# Patient Record
Sex: Male | Born: 1975 | Hispanic: Yes | Marital: Married | Smoking: Never smoker
Health system: Southern US, Community
[De-identification: ages and names within clinical notes are randomized; demographics above are authoritative.]

---

## 2012-04-05 ENCOUNTER — Inpatient Hospital Stay: Payer: Self-pay | Admitting: Internal Medicine

## 2012-04-05 LAB — COMPREHENSIVE METABOLIC PANEL
Albumin: 3.1 g/dL — ABNORMAL LOW (ref 3.4–5.0)
Alkaline Phosphatase: 114 U/L (ref 50–136)
Co2: 24 mmol/L (ref 21–32)
EGFR (Non-African Amer.): >60
Glucose: 135 mg/dL — ABNORMAL HIGH (ref 65–99)
Potassium: 2.3 mmol/L — CL (ref 3.5–5.1)
SGOT(AST): 253 U/L — ABNORMAL HIGH (ref 15–37)
Sodium: 112 mmol/L — CL (ref 136–145)

## 2012-04-05 LAB — URINALYSIS, COMPLETE
Bacteria: NONE SEEN
Glucose,UR: 150 mg/dL (ref 0–75)
Hyaline Cast: 1
Leukocyte Esterase: NEGATIVE
Nitrite: NEGATIVE
Ph: 6 (ref 4.5–8.0)
Protein: 30
RBC,UR: 2 /HPF (ref 0–5)
Specific Gravity: 1.008 (ref 1.003–1.030)
Squamous Epithelial: NONE SEEN

## 2012-04-05 LAB — BASIC METABOLIC PANEL
Anion Gap: 10 (ref 7–16)
BUN: 4 mg/dL — ABNORMAL LOW (ref 7–18)
Calcium, Total: 8 mg/dL — ABNORMAL LOW (ref 8.5–10.1)
Chloride: 86 mmol/L — ABNORMAL LOW (ref 98–107)
Osmolality: 256 (ref 275–301)
Potassium: 2.4 mmol/L — CL (ref 3.5–5.1)
Sodium: 128 mmol/L — ABNORMAL LOW (ref 136–145)

## 2012-04-05 LAB — CBC
HCT: 41.8 % (ref 40.0–52.0)
HGB: 15.1 g/dL (ref 13.0–18.0)
MCH: 30.8 pg (ref 26.0–34.0)
MCHC: 36.1 g/dL — ABNORMAL HIGH (ref 32.0–36.0)
MCV: 85 fL (ref 80–100)
RBC: 4.91 10*6/uL (ref 4.40–5.90)
RDW: 12.7 % (ref 11.5–14.5)
WBC: 18 10*3/uL — ABNORMAL HIGH (ref 3.8–10.6)

## 2012-04-05 LAB — DRUG SCREEN, URINE
Barbiturates, Ur Screen: NEGATIVE (ref ?–200)
MDMA (Ecstasy)Ur Screen: NEGATIVE (ref ?–500)
Methadone, Ur Screen: NEGATIVE (ref ?–300)
Opiate, Ur Screen: NEGATIVE (ref ?–300)
Phencyclidine (PCP) Ur S: NEGATIVE (ref ?–25)
Tricyclic, Ur Screen: NEGATIVE (ref ?–1000)

## 2012-04-05 LAB — PROTIME-INR: Prothrombin Time: 14.5 secs (ref 11.5–14.7)

## 2012-04-05 LAB — TSH: Thyroid Stimulating Horm: 0.33 u[IU]/mL — ABNORMAL LOW

## 2012-04-05 LAB — LIPASE, BLOOD: Lipase: 310 U/L (ref 73–393)

## 2012-04-06 LAB — CBC WITH DIFFERENTIAL/PLATELET
Basophil #: 0 10*3/uL (ref 0.0–0.1)
Basophil %: 0.1 %
Eosinophil %: 0 %
HCT: 41.3 % (ref 40.0–52.0)
Lymphocyte #: 0.9 10*3/uL — ABNORMAL LOW (ref 1.0–3.6)
Lymphocyte %: 8.7 %
MCH: 31.1 pg (ref 26.0–34.0)
MCHC: 35.5 g/dL (ref 32.0–36.0)
Monocyte %: 9.6 %
Neutrophil #: 8.6 10*3/uL — ABNORMAL HIGH (ref 1.4–6.5)
Neutrophil %: 81.6 %
Platelet: 80 10*3/uL — ABNORMAL LOW (ref 150–440)

## 2012-04-06 LAB — BASIC METABOLIC PANEL
Anion Gap: 13 (ref 7–16)
Anion Gap: 8 (ref 7–16)
BUN: 2 mg/dL — ABNORMAL LOW (ref 7–18)
Calcium, Total: 7.7 mg/dL — ABNORMAL LOW (ref 8.5–10.1)
Chloride: 91 mmol/L — ABNORMAL LOW (ref 98–107)
Chloride: 96 mmol/L — ABNORMAL LOW (ref 98–107)
Co2: 28 mmol/L (ref 21–32)
Co2: 30 mmol/L (ref 21–32)
Creatinine: 0.67 mg/dL (ref 0.60–1.30)
Creatinine: 0.76 mg/dL (ref 0.60–1.30)
EGFR (African American): >60
EGFR (Non-African Amer.): >60
EGFR (Non-African Amer.): >60
Glucose: 119 mg/dL — ABNORMAL HIGH (ref 65–99)
Glucose: 167 mg/dL — ABNORMAL HIGH (ref 65–99)
Osmolality: 268 (ref 275–301)
Potassium: 2.6 mmol/L — ABNORMAL LOW (ref 3.5–5.1)
Sodium: 132 mmol/L — ABNORMAL LOW (ref 136–145)
Sodium: 134 mmol/L — ABNORMAL LOW (ref 136–145)

## 2012-04-06 LAB — HEPATIC FUNCTION PANEL A (ARMC)
Albumin: 2.9 g/dL — ABNORMAL LOW (ref 3.4–5.0)
Alkaline Phosphatase: 125 U/L (ref 50–136)
Bilirubin, Direct: 0.4 mg/dL — ABNORMAL HIGH (ref 0.00–0.20)
Bilirubin,Total: 1.1 mg/dL — ABNORMAL HIGH (ref 0.2–1.0)
SGPT (ALT): 124 U/L — ABNORMAL HIGH
Total Protein: 6 g/dL — ABNORMAL LOW (ref 6.4–8.2)

## 2012-04-06 LAB — HEMOGLOBIN A1C: Hemoglobin A1C: 6.1 % (ref 4.2–6.3)

## 2012-04-07 LAB — BASIC METABOLIC PANEL
Anion Gap: 9 (ref 7–16)
BUN: 3 mg/dL — ABNORMAL LOW (ref 7–18)
Calcium, Total: 8 mg/dL — ABNORMAL LOW (ref 8.5–10.1)
Chloride: 98 mmol/L (ref 98–107)
Co2: 29 mmol/L (ref 21–32)
Creatinine: 0.76 mg/dL (ref 0.60–1.30)
EGFR (African American): >60
EGFR (Non-African Amer.): >60
Glucose: 126 mg/dL — ABNORMAL HIGH (ref 65–99)
Osmolality: 270 (ref 275–301)
Potassium: 3 mmol/L — ABNORMAL LOW (ref 3.5–5.1)
Sodium: 136 mmol/L (ref 136–145)

## 2012-04-07 LAB — MAGNESIUM: Magnesium: 1.9 mg/dL

## 2013-05-16 ENCOUNTER — Emergency Department: Payer: Self-pay | Admitting: Emergency Medicine

## 2013-05-16 LAB — COMPREHENSIVE METABOLIC PANEL
Albumin: 4.4 g/dL (ref 3.4–5.0)
Alkaline Phosphatase: 102 U/L (ref 50–136)
BUN: 4 mg/dL — ABNORMAL LOW (ref 7–18)
Bilirubin,Total: 0.6 mg/dL (ref 0.2–1.0)
Calcium, Total: 8.8 mg/dL (ref 8.5–10.1)
EGFR (African American): >60
EGFR (Non-African Amer.): >60
SGOT(AST): 45 U/L — ABNORMAL HIGH (ref 15–37)
SGPT (ALT): 38 U/L (ref 12–78)
Sodium: 140 mmol/L (ref 136–145)

## 2013-05-16 LAB — CBC
HCT: 56.6 % — ABNORMAL HIGH (ref 40.0–52.0)
HGB: 19.4 g/dL — ABNORMAL HIGH (ref 13.0–18.0)
MCH: 31.8 pg (ref 26.0–34.0)
MCHC: 34.3 g/dL (ref 32.0–36.0)
Platelet: 256 10*3/uL (ref 150–440)
RBC: 6.1 10*6/uL — ABNORMAL HIGH (ref 4.40–5.90)
RDW: 16.1 % — ABNORMAL HIGH (ref 11.5–14.5)
WBC: 13.2 10*3/uL — ABNORMAL HIGH (ref 3.8–10.6)

## 2013-05-16 LAB — ETHANOL
Ethanol %: 0.431 % (ref 0.000–0.080)
Ethanol: 431 mg/dL

## 2013-06-04 ENCOUNTER — Emergency Department: Payer: Self-pay | Admitting: Emergency Medicine

## 2013-06-18 ENCOUNTER — Emergency Department: Payer: Self-pay | Admitting: Emergency Medicine

## 2013-06-18 LAB — COMPREHENSIVE METABOLIC PANEL
Albumin: 4.4 g/dL (ref 3.4–5.0)
BUN: 5 mg/dL — ABNORMAL LOW (ref 7–18)
Calcium, Total: 8.9 mg/dL (ref 8.5–10.1)
Chloride: 106 mmol/L (ref 98–107)
Co2: 24 mmol/L (ref 21–32)
Creatinine: 0.61 mg/dL (ref 0.60–1.30)
EGFR (African American): >60
Glucose: 115 mg/dL — ABNORMAL HIGH (ref 65–99)
Osmolality: 285 (ref 275–301)
Potassium: 3.5 mmol/L (ref 3.5–5.1)
SGOT(AST): 62 U/L — ABNORMAL HIGH (ref 15–37)
Sodium: 144 mmol/L (ref 136–145)

## 2013-06-18 LAB — URINALYSIS, COMPLETE
Bilirubin,UR: NEGATIVE
Ketone: NEGATIVE
Nitrite: NEGATIVE
Ph: 6 (ref 4.5–8.0)
RBC,UR: <1 /HPF (ref 0–5)
Squamous Epithelial: NONE SEEN
WBC UR: <1 /HPF (ref 0–5)

## 2013-06-18 LAB — CBC
HGB: 18.1 g/dL — ABNORMAL HIGH (ref 13.0–18.0)
MCV: 96 fL (ref 80–100)
Platelet: 233 10*3/uL (ref 150–440)
RBC: 5.48 10*6/uL (ref 4.40–5.90)
RDW: 15.3 % — ABNORMAL HIGH (ref 11.5–14.5)
WBC: 7.5 10*3/uL (ref 3.8–10.6)

## 2013-06-18 LAB — ETHANOL: Ethanol: 405 mg/dL

## 2013-06-18 LAB — TSH: Thyroid Stimulating Horm: 3.32 u[IU]/mL

## 2013-06-19 LAB — DRUG SCREEN, URINE
Amphetamines, Ur Screen: NEGATIVE (ref ?–1000)
Cannabinoid 50 Ng, Ur ~~LOC~~: NEGATIVE (ref ?–50)
Cocaine Metabolite,Ur ~~LOC~~: NEGATIVE (ref ?–300)
MDMA (Ecstasy)Ur Screen: NEGATIVE (ref ?–500)
Methadone, Ur Screen: NEGATIVE (ref ?–300)
Phencyclidine (PCP) Ur S: NEGATIVE (ref ?–25)
Tricyclic, Ur Screen: NEGATIVE (ref ?–1000)

## 2015-02-23 NOTE — H&P (Signed)
PATIENT NAME:  Damon Coleman, Damon Coleman MR#:  [id number] DATE OF BIRTH:  09-Aug-1976  DATE OF ADMISSION:  04/05/2012  PRIMARY CARE PHYSICIAN: None.   REFERRING PHYSICIAN:   Olivia Mackie, MD    CHIEF COMPLAINT: Not feeling well as well as seizure.   HISTORY OF PRESENT ILLNESS: The patient is a 39 year old Hispanic male who is brought in by family due to "not feeling well." On further questioning, there was a concern that he had a seizure yesterday with his tongue being bit. Initially, the patient reported that he does not drink much, but on further questioning he reports that he drinks at least 12 to more beers per day. The patient also was noted to have severe hyponatremia as well as hypokalemia. The patient reports that he has been having some abdominal pain in the epigastric region and has been nauseous and throwing up since yesterday. He has not had any fevers or chills. He does not remember having a seizure, but his family, his brother is at bedside, reports that he had a jerking type of movement. The patient reports that his last drink was yesterday. However, his alcohol level is less than 0.003%. Otherwise, he denies any chest pain, shortness of breath, palpitations. No syncope. He denies any urinary symptoms. No headaches or photophobia.   PAST MEDICAL HISTORY: None.   PAST SURGICAL HISTORY: None.   ALLERGIES: None.   MEDICATIONS: None.   SOCIAL HISTORY: He smokes one cigarette possibly on a daily basis, nothing more than that. He drinks at least 12 cans or more a day. He denies any liquor use. He denies any drug use.   FAMILY HISTORY: He is not aware of any family history.    REVIEW OF SYSTEMS: CONSTITUTIONAL: Denies any fevers. Complains of fatigue, weakness. Complains of epigastric pain. No weight loss. No weight gain. EYES: No blurred or double vision. No redness. No inflammation. No glaucoma. No cataracts. ENT: No tinnitus, no ear pain. No hearing loss. No seasonal or year-round allergies. No  epistaxis. No nasal discharge. No redness of the oropharynx. RESPIRATORY: He does complain of a nonproductive cough since yesterday. No wheezing. No hemoptysis. No chronic obstructive pulmonary disease. No tuberculosis. CARDIOVASCULAR: No chest pain. No orthopnea. No edema. No arrhythmia. GASTROINTESTINAL: No nausea, vomiting, diarrhea. No abdominal pain. No hematemesis. No melena. No gastroesophageal reflux disease. No irritable bowel syndrome. GENITOURINARY: Denies any dysuria, hematuria, renal calculus, or frequency. ENDOCRINE: Denies any polyuria, nocturia, or thyroid problems. HEME/LYMPH: Denies anemia, easy bruisability, or bleeding. SKIN: No acne. No rash. No changes in mole, hair or skin. MUSCULOSKELETAL: Denies any pain in the neck, back, or shoulder. NEUROLOGICAL: No numbness. No weakness. No cerebrovascular accident. No transient ischemic attack. PSYCHIATRIC: No anxiety. No insomnia. No ADD. No OCD.   PHYSICAL EXAMINATION:  VITAL SIGNS: Temperature 100.4, pulse 100, respirations 20, blood pressure 174/66.   GENERAL: The patient is a 39 year old Spanish male currently not in any acute distress.   HEENT: Head atraumatic, normocephalic. Pupils are equally round, reactive to light and accommodation. Extraocular movements are intact. There is no conjunctival pallor. No scleral icterus. Oropharynx is dry. There are no exudates. External ear exam shows no drainage or ulceration. Nasal exam shows no erythema.   NECK: No thyromegaly. No carotid bruits.    CARDIOVASCULAR: Regular rate and rhythm. No murmurs, rubs, clicks, or gallops. PMI is not displaced.   LUNGS: Clear to auscultation bilaterally without any rales, rhonchi, or wheezing.   ABDOMEN: Soft. There is tenderness but no guarding,  no rebound.   EXTREMITIES: No clubbing, cyanosis, or edema.   SKIN: No rash.   LYMPHATICS: No lymph nodes palpable.   VASCULAR: Good DP, PT pulses.   PSYCHIATRIC: Not anxious or depressed.    NEUROLOGICAL: Awake, alert, oriented x3. No focal deficits.   SKIN: No rash.   LABORATORY, DIAGNOSTIC AND RADIOLOGICAL DATA:  In the ED,  CT scan of the head shows no acute abnormality.  WBC 18.0, hemoglobin 15, platelet count 106. Glucose 135, BUN 6, creatinine 0.52, sodium 112, potassium 2.3, chloride 74, CO2 24, calcium is 7.2, bilirubin total 2.3, alkaline phosphatase 114, ALT 109, AST 253, total protein 6.6, albumin 3.1.  Alcohol level is less than 0.003.  TSH is 0.33.  Urinalysis shows 3+ blood, nitrites negative, leukocytes negative, RBCs only 2.  TUDS were negative.   ASSESSMENT AND PLAN: The patient is a 39 year old Spanish male who presented to the ED with not feeling well, possible seizure at home yesterday, noted to have severe hypokalemia, hyponatremia.   1. Seizure: Likely due to alcohol withdrawal, possibly due to hyponatremia. At this time, we will hold off on any antiepileptics, follow for any further signs or symptoms of withdrawal as well as seizure. If he continues to have seizures, we will consider starting him on Dilantin and get  EEG and Neurological evaluation.  2. Severe electrolyte imbalances: Likely related to his nausea, throwing up, as well as alcohol abuse. Also, his hyponatremia could be due to beer potomania. We will place him on normal saline, follow his electrolytes. I will also check a magnesium, replace as needed.  3. Leukocytosis: He has a cough, low-grade temperature. We will check a chest x-ray, rule out pneumonia, possible aspiration.  4. Abdominal pain: Check lipase to rule out pancreatitis. If chest x-ray is negative, lipase is normal, we will consider getting a CT scan of the abdomen.  5. Alcohol abuse: We will place him on CIWA protocol. The patient was counseled regarding the importance of quitting drinking.     TIME SPENT:   35 minutes.   ____________________________ Lacie ScottsShreyang H. Allena KatzPatel, MD shp:cbb D: 04/05/2012 17:04:42 ET T: 04/05/2012  18:21:23 ET JOB#: [id number]  cc: Calogero Geisen H. Allena KatzPatel, MD, <Dictator> Charise CarwinSHREYANG H Adriahna Shearman MD ELECTRONICALLY SIGNED 04/10/2012 8:04

## 2015-02-23 NOTE — Discharge Summary (Signed)
PATIENT NAME:  Damon Coleman, [name] MR#:  [id number] DATE OF BIRTH:  05/09/1976  DATE OF ADMISSION:  04/05/2012 DATE OF DISCHARGE:  04/07/2012  ADMISSION DIAGNOSE:  1. Seizure, likely alcohol withdrawal.  2. Severe electrolyte imbalances.   DISCHARGE DIAGNOSES:  1. Probable seizure, likely alcohol withdrawal.  2. Electrolyte imbalances with hyponatremia and hypokalemia due to alcoholic binge and poor p.o. intake.  3. Leukocytosis.  4. Alcohol abuse.  5. Thrombocytopenia.   CONSULTS: None.   LABORATORY, DIAGNOSTIC, AND RADIOLOGICAL DATA: Sodium 136, potassium 3.0, chloride 98, bicarb 29, BUN 3, creatinine 0.76, glucose 126. Magnesium 1.9. A1c 6.1. White blood cells 10.5, hemoglobin 14.7, hematocrit 41.3, platelets 80.   HOSPITAL COURSE: This is a 39 year old male who went on a drinking binge. He was brought in by his brother for weakness and possible seizure. For further details, please refer to the history and physical.  1. Seizure, likely alcohol withdrawal. No episodes of seizure were seen here.  2. Tachycardia secondary to alcohol withdrawal and dehydration. The patient's tachycardia resolved.  3. Hyponatremia and hypokalemia from decreased p.o. intake and binge drinking which improved with IV fluids and replacement of the potassium. Magnesium level was normal.  4. Alcoholic hepatitis. We encouraged the patient to stop drinking and Case Management did provide some information regarding alcohol substance abuse.  5. Leukocytosis, likely stress induced which has resolved.   DISCHARGE MEDICATIONS: Librium 25 mg p.o. q.8 hours p.r.n. tapering doses over the next three days until stopped.   FOLLOW-UP: The patient will follow-up with [facility] in 1 to 2 days.   DISCHARGE DIET: Regular.   DISCHARGE ACTIVITY: As tolerated.   TIME SPENT: Approximately 35 minutes.   ____________________________ Janyth ContesSital P. Juliene PinaMody, MD spm:drc D: 04/07/2012 13:53:00 ET T: 04/07/2012 16:09:01  ET JOB#: [id number]  cc: Latania Bascomb P. Juliene PinaMody, MD, <Dictator> [facility] Peightyn Roberson P Emoni Whitworth MD ELECTRONICALLY SIGNED 04/12/2012 12:26

## 2015-05-17 IMAGING — CT CT HEAD WITHOUT CONTRAST
2 series · 16 of 30 positions shown, 20 images · non-contrast
Comparison: none

REASON FOR EXAM: +ETOH, FALL
COMMENTS:

PROCEDURE:     CT  - CT HEAD WITHOUT CONTRAST  - May 16, 2013  [DATE]
RESULT:     Comparison:  None
TECHNIQUE: Multiple axial images from the foramen magnum to the vertex were
obtained without IV contrast.

[Series 2: without · axial · non-contrast · 0.41mm/px · z∈[+968,+1108]mm · 13 of 34 slices shown, 17 images]
[im 3/34  brain]
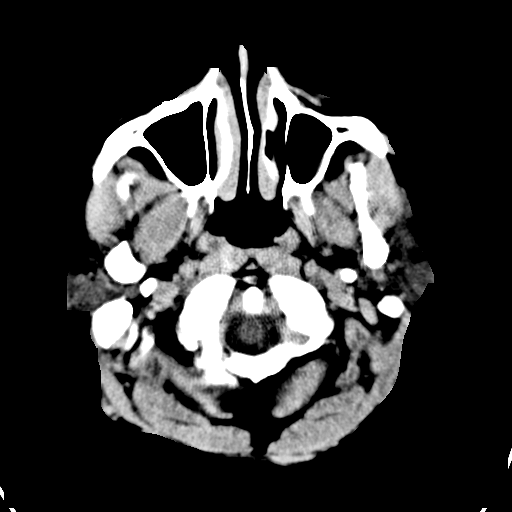
[im 3/34  bone]
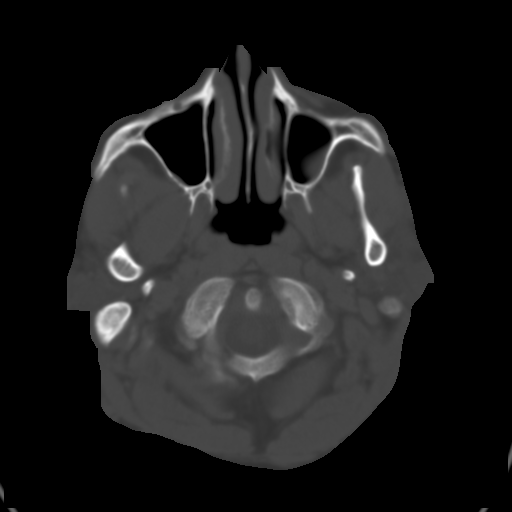
[im 5/34  brain]
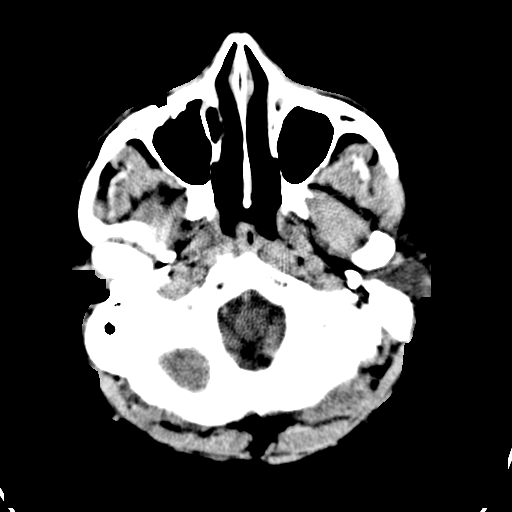
[im 8/34  brain]
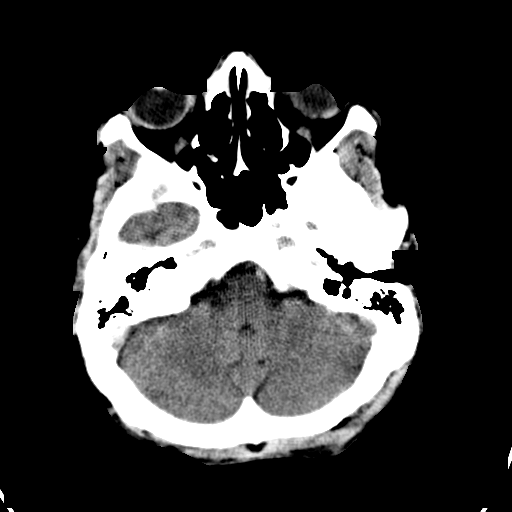
[im 10/34  brain]
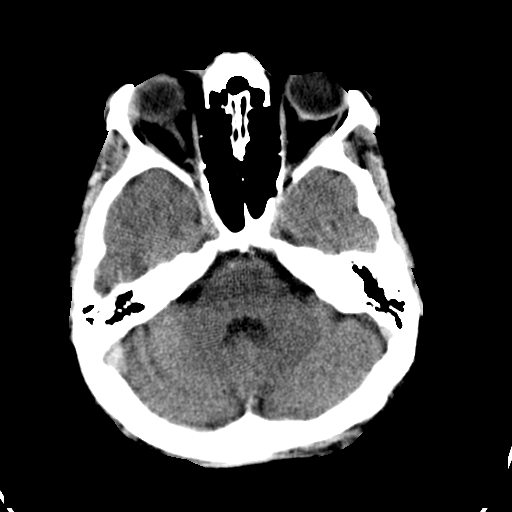
[im 12/34  brain]
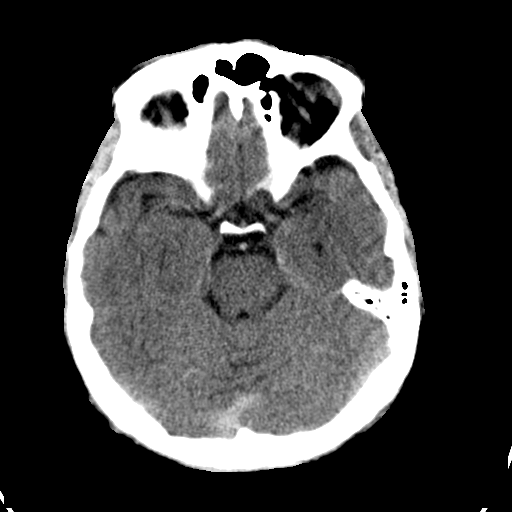
[im 12/34  bone]
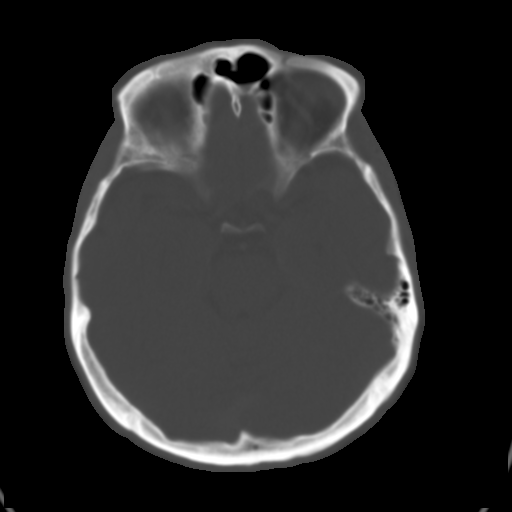
[im 15/34  brain]
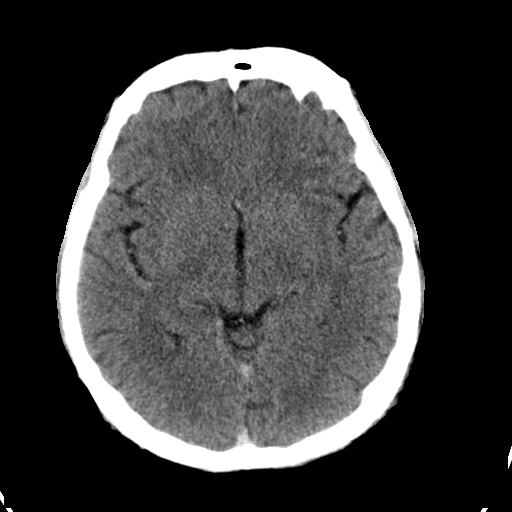
[im 17/34  brain]
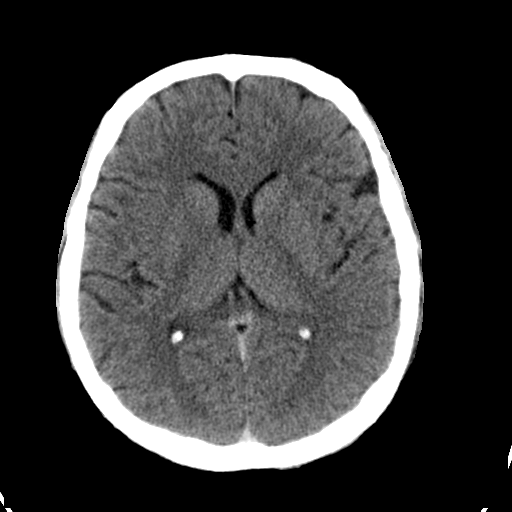
[im 19/34  brain]
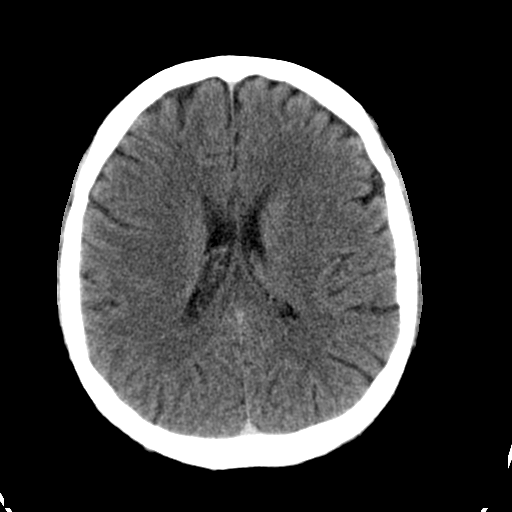
[im 22/34  brain]
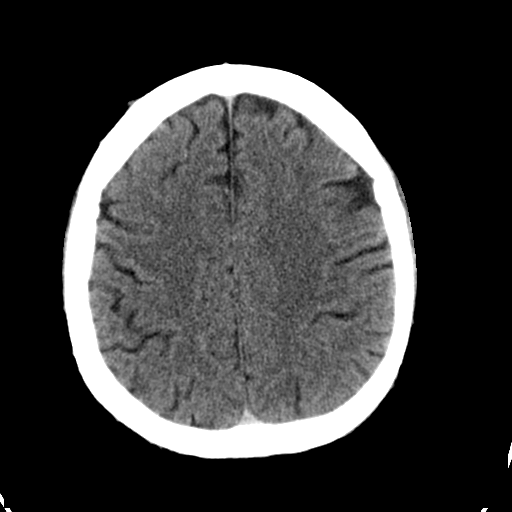
[im 22/34  bone]
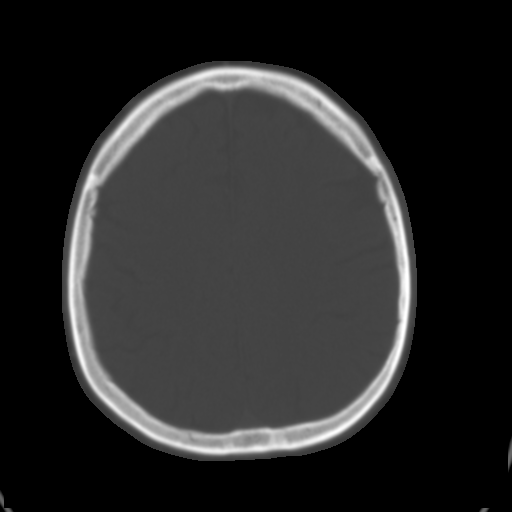
[im 24/34  brain]
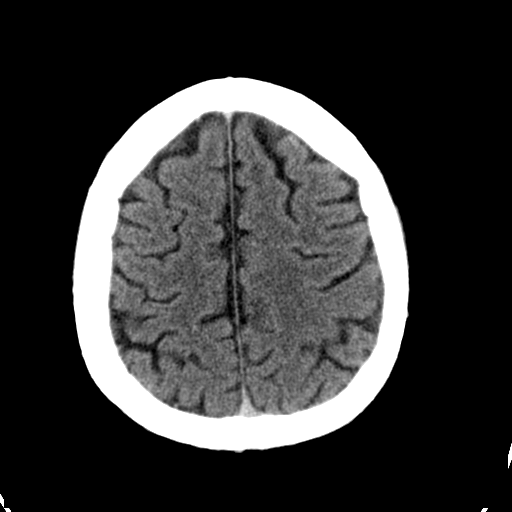
[im 26/34  brain]
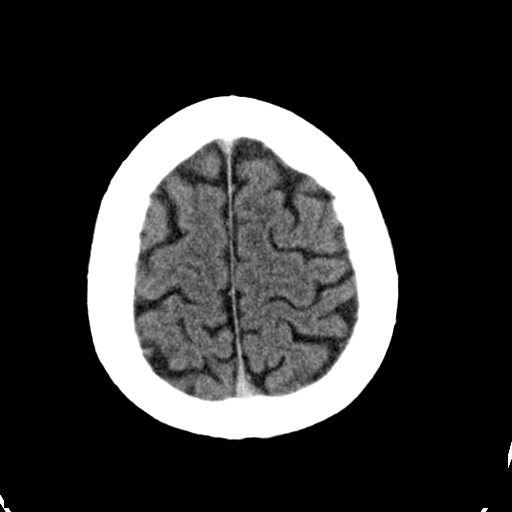
[im 29/34  brain]
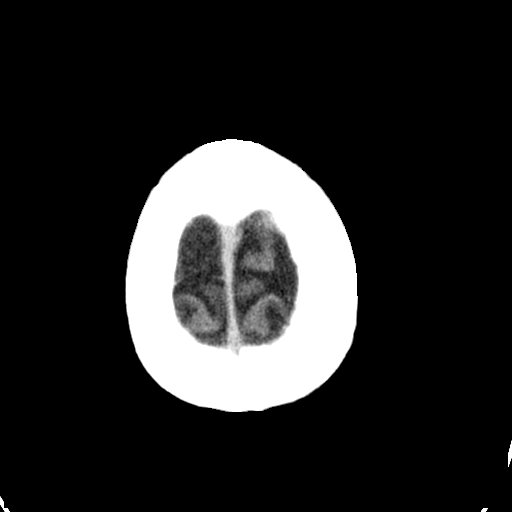
[im 31/34  brain]
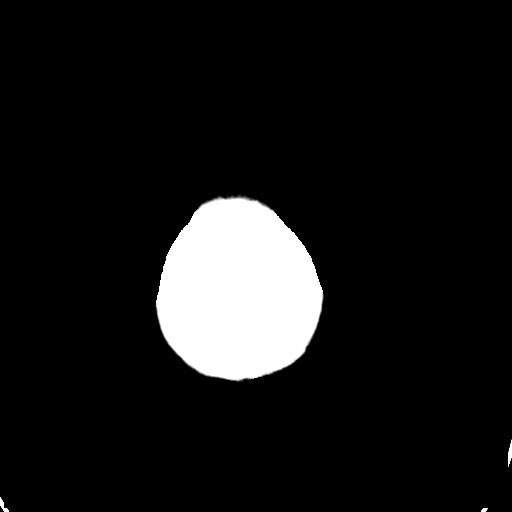
[im 31/34  bone]
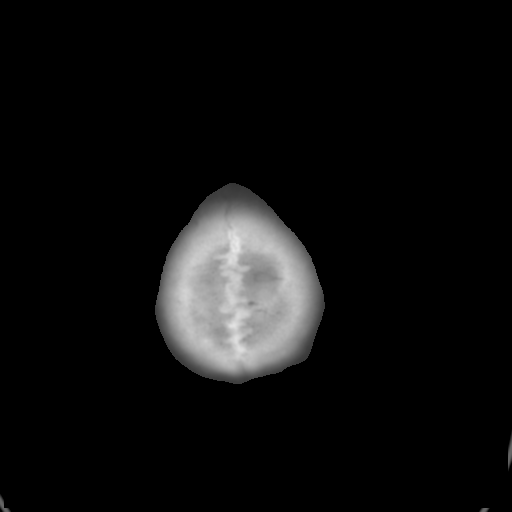

[Series 3: bone · axial · 0.41mm/px · z∈[+968,+1013]mm · 3 of 34 slices shown]
[im 3/34  bone]
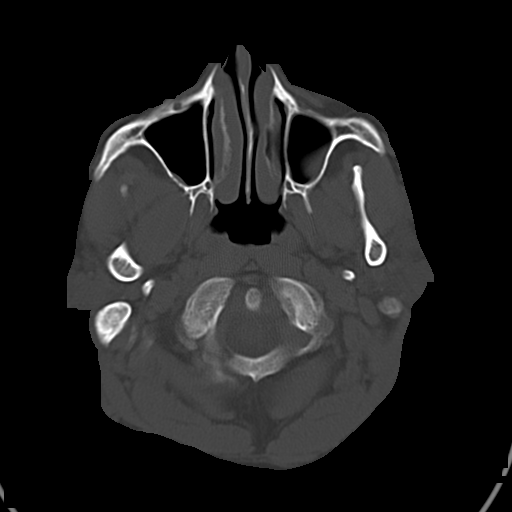
[im 8/34  bone]
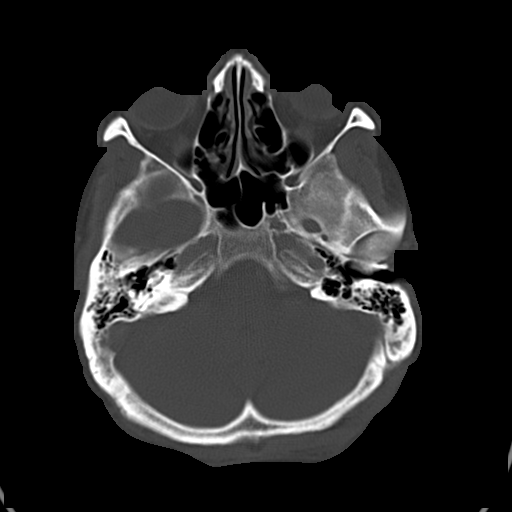
[im 12/34  bone]
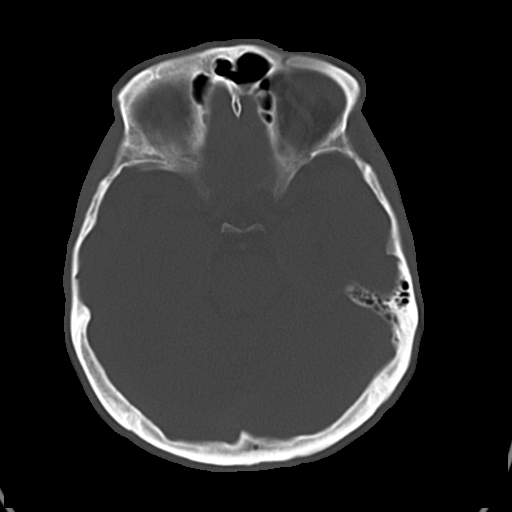

[16 of 30 positions shown; findings below may reference images not displayed]

FINDINGS: There is no evidence of mass effect, midline shift, or extra-axial fluid
collections.  There is no evidence of a space-occupying lesion or
intracranial hemorrhage. There is no evidence of a cortical-based area of
acute infarction.

The ventricles and sulci are appropriate for the patient's age. The basal
cisterns are patent.

Visualized portions of the orbits are unremarkable. There is a small left
maxillary sinus mucus retention cyst.

The osseous structures are unremarkable. There is a tiny amount of
subcutaneous emphysema in the left parietal scalp and left pinna.
IMPRESSION: No acute intracranial process.

[REDACTED]

## 2015-05-17 IMAGING — CT CT CERVICAL SPINE WITHOUT CONTRAST
3 series · 16 of 33 positions shown, 19 images · non-contrast
Comparison: None

REASON FOR EXAM: +ETOH, FALL
COMMENTS:   May transport without cardiac monitor

PROCEDURE:     CT  - CT CERVICAL SPINE WO  - May 16, 2013 [DATE]
RESULT:     Clinical Indication: Trauma
TECHNIQUE: Multiple axial CT images from the skull base to the mid vertebral
body of T1. obtained with sagittal and coronal reformatted images provided.

[Series 3: axial · axial · 0.33mm/px · z∈[+837,+973]mm · 8 of 85 slices shown, 10 images]
[im 7/85  soft-tissue]
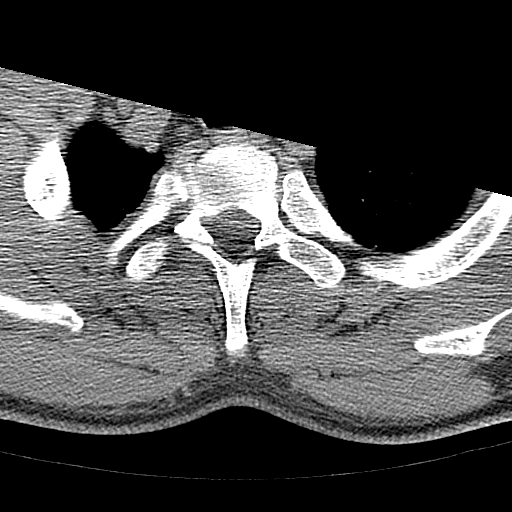
[im 7/85  bone]
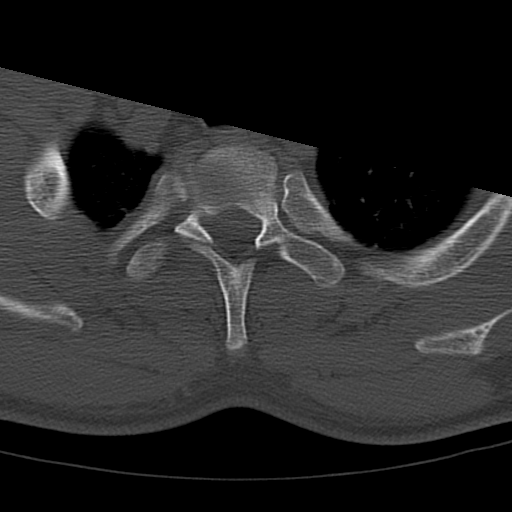
[im 20/85  bone]
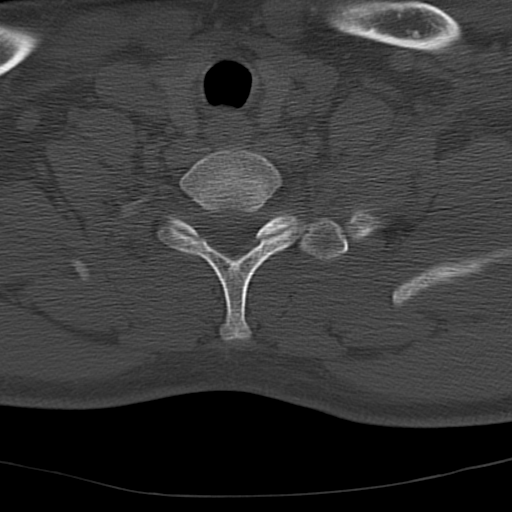
[im 26/85  bone]
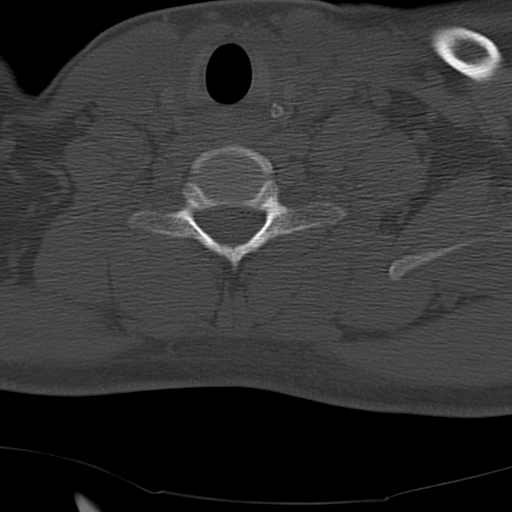
[im 39/85  bone]
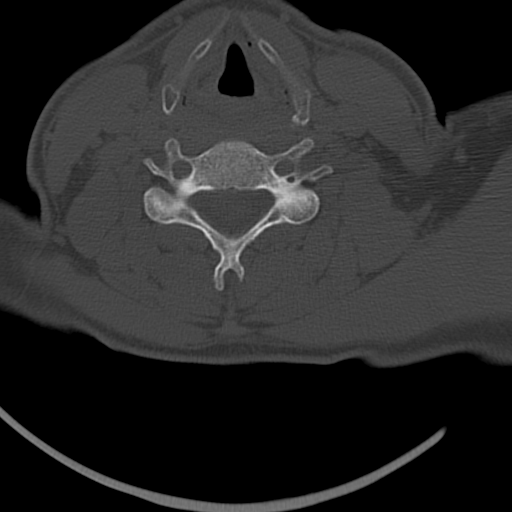
[im 46/85  soft-tissue]
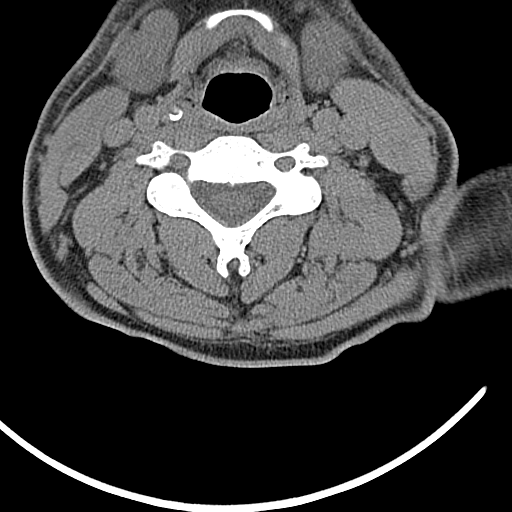
[im 46/85  bone]
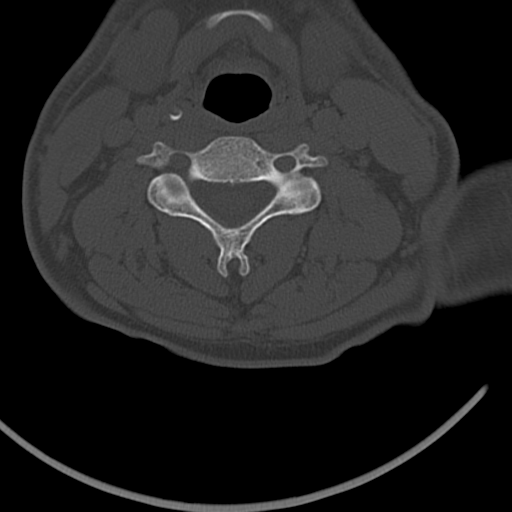
[im 59/85  bone]
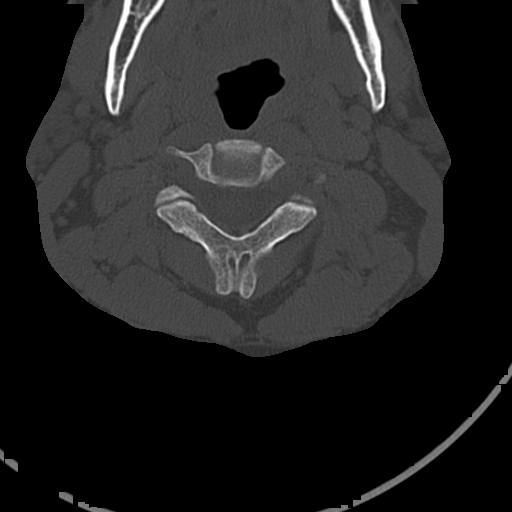
[im 65/85  bone]
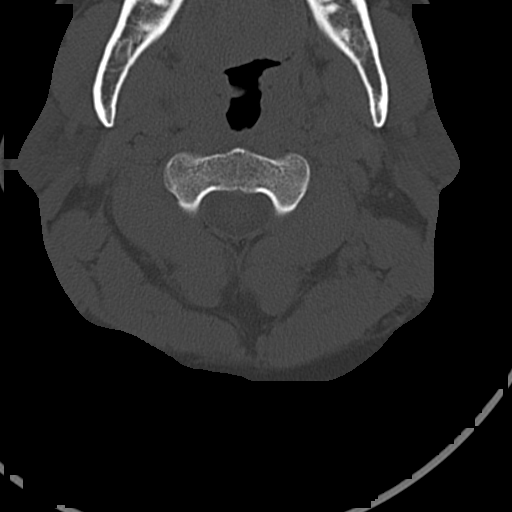
[im 78/85  bone]
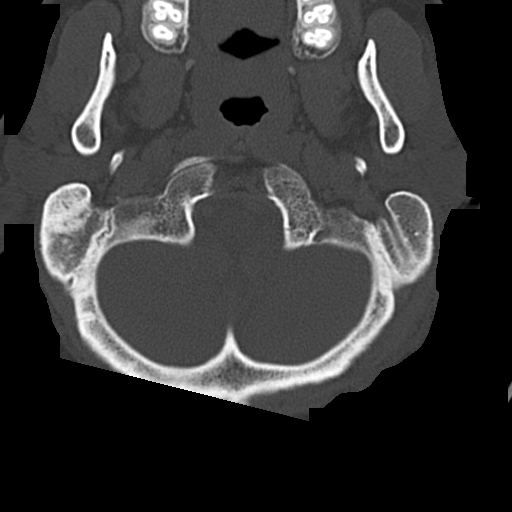

[Series 4: sagittal · sagittal · 0.38mm/px · 5 of 46 slices shown, 6 images]
[im 16/46  bone]
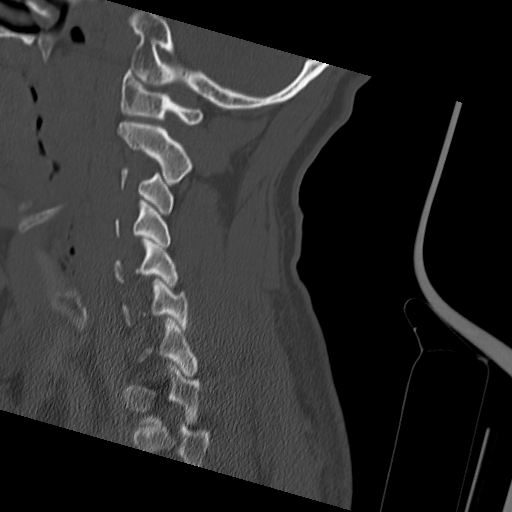
[im 19/46  bone]
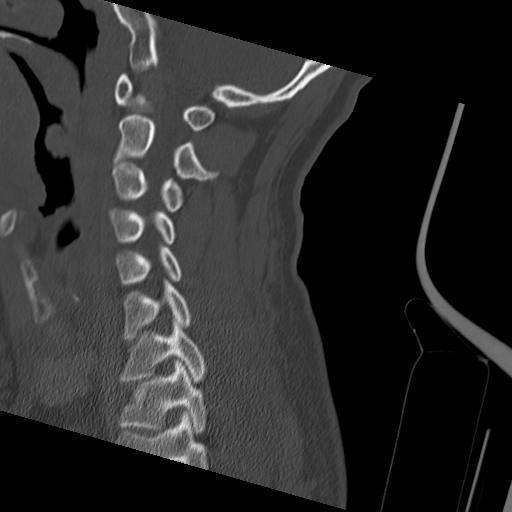
[im 23/46  soft-tissue]
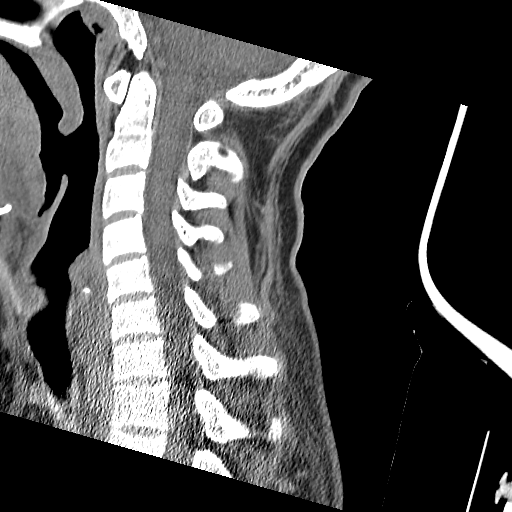
[im 23/46  bone]
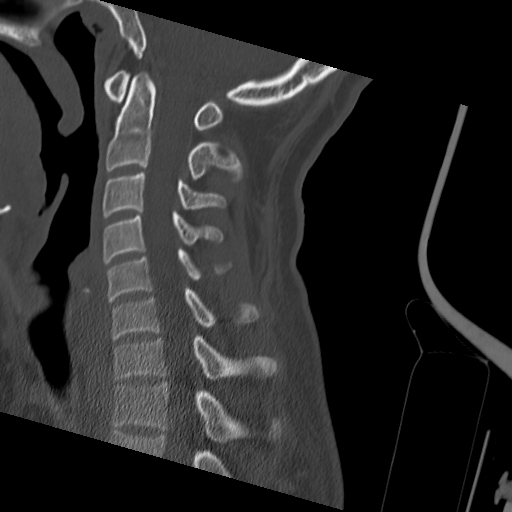
[im 27/46  bone]
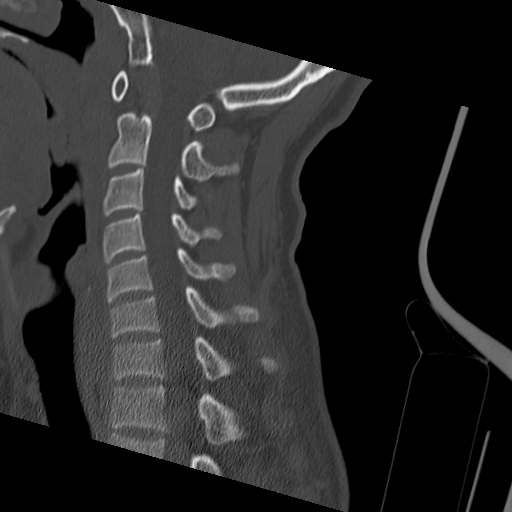
[im 31/46  bone]
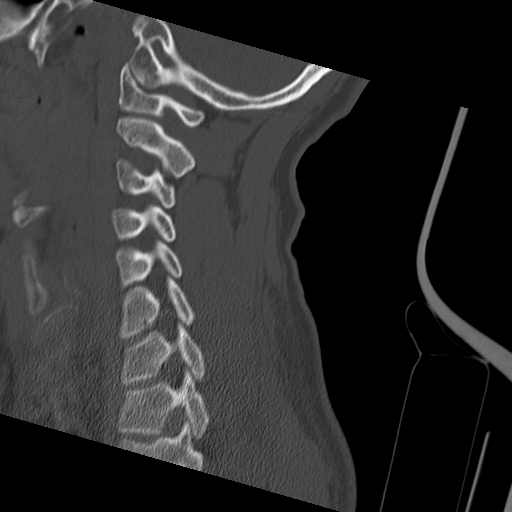

[Series 5: coronal · coronal · 0.39mm/px · 3 of 43 slices shown]
[im 9/43  bone]
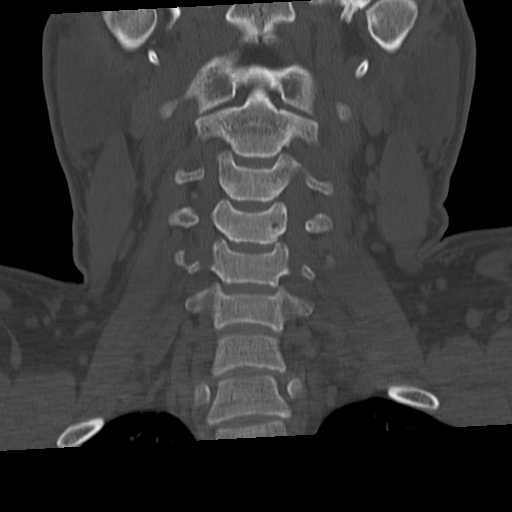
[im 17/43  bone]
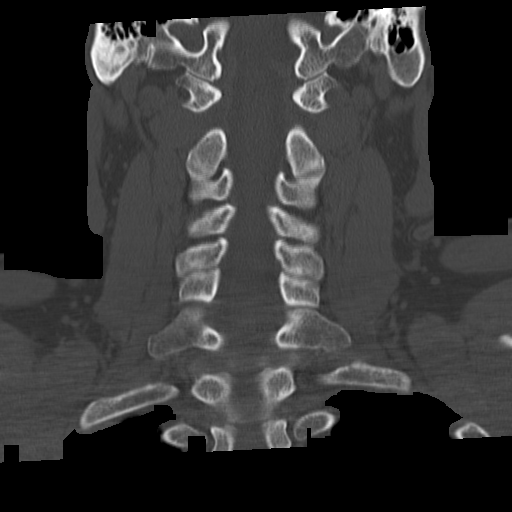
[im 26/43  bone]
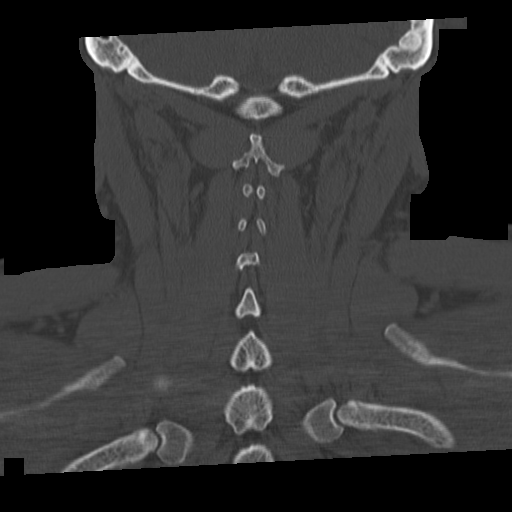

[16 of 33 positions shown; findings below may reference images not displayed]

FINDINGS: The alignment is anatomic. The vertebral body heights are maintained. There
is no acute fracture or static listhesis. The prevertebral soft tissues are
normal. The intraspinal soft tissues are not fully imaged on this
examination due to poor soft tissue contrast, but there is no soft tissue
gross abnormality.

The disc spaces are maintained.

The visualized portions of the lung apices demonstrate no focal abnormality.
IMPRESSION: 1. No acute osseous injury of the cervical spine.

2. Ligamentous injury is not evaluated. If there is high clinical concern
for ligamentous injury, consider MRI or flexion/extension radiographs as
clinically indicated and tolerated.

[REDACTED]

## 2015-06-05 ENCOUNTER — Encounter: Payer: Self-pay | Admitting: Urgent Care

## 2015-06-05 ENCOUNTER — Emergency Department
Admission: EM | Admit: 2015-06-05 | Discharge: 2015-06-06 | Disposition: A | Discharge status: Home / Self Care | Payer: Self-pay | Attending: Emergency Medicine | Admitting: Emergency Medicine

## 2015-06-05 DIAGNOSIS — F10920 Alcohol use, unspecified with intoxication, uncomplicated: Secondary | ICD-10-CM

## 2015-06-05 DIAGNOSIS — F10120 Alcohol abuse with intoxication, uncomplicated: Secondary | ICD-10-CM | POA: Insufficient documentation

## 2015-06-05 LAB — COMPREHENSIVE METABOLIC PANEL
ALBUMIN: 5 g/dL (ref 3.5–5.0)
ALK PHOS: 75 U/L (ref 38–126)
ALT: 40 U/L (ref 17–63)
AST: 51 U/L — ABNORMAL HIGH (ref 15–41)
Anion gap: 16 — ABNORMAL HIGH (ref 5–15)
BILIRUBIN TOTAL: 0.4 mg/dL (ref 0.3–1.2)
BUN: 6 mg/dL (ref 6–20)
CALCIUM: 8.9 mg/dL (ref 8.9–10.3)
CHLORIDE: 101 mmol/L (ref 101–111)
CO2: 25 mmol/L (ref 22–32)
CREATININE: 0.63 mg/dL (ref 0.61–1.24)
GFR calc Af Amer: >60 mL/min (ref >60)
Glucose, Bld: 109 mg/dL — ABNORMAL HIGH (ref 65–99)
POTASSIUM: 3.9 mmol/L (ref 3.5–5.1)
Sodium: 142 mmol/L (ref 135–145)
TOTAL PROTEIN: 8.8 g/dL — AB (ref 6.5–8.1)

## 2015-06-05 LAB — URINALYSIS COMPLETE WITH MICROSCOPIC (ARMC ONLY)
BACTERIA UA: NONE SEEN
Bilirubin Urine: NEGATIVE
Glucose, UA: NEGATIVE mg/dL
Hgb urine dipstick: NEGATIVE
KETONES UR: NEGATIVE mg/dL
Leukocytes, UA: NEGATIVE
Nitrite: NEGATIVE
Protein, ur: NEGATIVE mg/dL
SPECIFIC GRAVITY, URINE: 1.002 — AB (ref 1.005–1.030)
Squamous Epithelial / LPF: NONE SEEN
WBC, UA: NONE SEEN WBC/hpf (ref 0–5)
pH: 6 (ref 5.0–8.0)

## 2015-06-05 LAB — CBC
HEMATOCRIT: 54.7 % — AB (ref 40.0–52.0)
Hemoglobin: 18.6 g/dL — ABNORMAL HIGH (ref 13.0–18.0)
MCH: 32.5 pg (ref 26.0–34.0)
MCHC: 34.1 g/dL (ref 32.0–36.0)
MCV: 95.6 fL (ref 80.0–100.0)
Platelets: 223 10*3/uL (ref 150–440)
RBC: 5.72 MIL/uL (ref 4.40–5.90)
RDW: 14.3 % (ref 11.5–14.5)
WBC: 8.8 10*3/uL (ref 3.8–10.6)

## 2015-06-05 LAB — URINE DRUG SCREEN, QUALITATIVE (ARMC ONLY)
Amphetamines, Ur Screen: NOT DETECTED
Barbiturates, Ur Screen: NOT DETECTED
Benzodiazepine, Ur Scrn: NOT DETECTED
Cannabinoid 50 Ng, Ur ~~LOC~~: NOT DETECTED
Cocaine Metabolite,Ur ~~LOC~~: NOT DETECTED
MDMA (Ecstasy)Ur Screen: NOT DETECTED
Methadone Scn, Ur: NOT DETECTED
Opiate, Ur Screen: NOT DETECTED
Phencyclidine (PCP) Ur S: NOT DETECTED
Tricyclic, Ur Screen: NOT DETECTED

## 2015-06-05 LAB — ETHANOL: Alcohol, Ethyl (B): 451 mg/dL (ref <5)

## 2015-06-05 MED ORDER — LORAZEPAM 2 MG PO TABS: 0.0000 mg | ORAL_TABLET | Freq: Four times a day (QID) | ORAL | Status: DC

## 2015-06-05 MED ORDER — LORAZEPAM 2 MG PO TABS: 0.0000 mg | ORAL_TABLET | Freq: Two times a day (BID) | ORAL | Status: DC

## 2015-06-05 MED ORDER — THIAMINE HCL 100 MG/ML IJ SOLN
Freq: Once | INTRAVENOUS | Status: AC
  Administered 2015-06-05: via INTRAVENOUS
  Filled 2015-06-05: qty 1000

## 2015-06-05 MED ORDER — THIAMINE HCL 100 MG/ML IJ SOLN: 100.0000 mg | Freq: Every day | INTRAMUSCULAR | Status: DC

## 2015-06-05 MED ORDER — VITAMIN B-1 100 MG PO TABS
100.0000 mg | ORAL_TABLET | Freq: Every day | ORAL | Status: DC
  Administered 2015-06-06: 100 mg via ORAL
  Filled 2015-06-05: qty 1

## 2015-06-05 NOTE — ED Notes (Signed)
Notifed Dr Pershing Proud of critical ETOH Level--- 451

## 2015-06-05 NOTE — ED Notes (Signed)
PA in room with pt at this time

## 2015-06-05 NOTE — ED Notes (Signed)
Patient presents to ED in custody of [employer] officer Earl Lites; under IVC. Papers indicate that patient has been intoxicated since Saturday. Patient has not been eating, bathing, and has been exhibiting erratic behavior. Patient not making sense when he is speaking per wife's report (via IVC papers). Patient denies SI/HI.

## 2015-06-05 NOTE — BH Assessment (Signed)
Assessment Note  Damon Coleman is an 39 y.o. male presenting to the ED via [employer] under IVC.  According to the IVC, pt has been intoxicated since Saturday, 06/04/2015 and has not eaten, bathed or showered.  The IVC reports that pt has been displaying erractic behavior and is not making sense.  Pt reports he consumed 5-6 12 ounce beers today (06/05/2015).  He reports his last meal was at 4 pm today.  Pt reports no difficulty with eating or sleeping. Pt denies using any other drugs.  Pt's BAC is 451.  Pt denies any SI/HI or AH/VH.    Axis I: Substance Abuse Axis II: Deferred Axis III: History reviewed. No pertinent past medical history. Axis IV: problems with primary support group Axis V: 61-70 mild symptoms  Past Medical History: History reviewed. No pertinent past medical history.  History reviewed. No pertinent past surgical history.  Family History: No family history on file.  Social History:  reports that he has never smoked. He does not have any smokeless tobacco history on file. He reports that he drinks alcohol. He reports that he does not use illicit drugs.  Additional Social History:  Alcohol / Drug Use History of alcohol / drug use?: Yes Negative Consequences of Use: Personal relationships Substance #1 Name of Substance 1: ETOH 1 - Age of First Use: since teens 1 - Amount (size/oz): 6 12 oz cans 1 - Frequency: daily 1 - Duration: all day 1 - Last Use / Amount: 06/05/15  CIWA: CIWA-Ar BP: 136/88 mmHg Pulse Rate: (!) 104 Nausea and Vomiting: no nausea and no vomiting Tactile Disturbances: none Tremor: no tremor Auditory Disturbances: not present Paroxysmal Sweats: no sweat visible Visual Disturbances: not present Anxiety: no anxiety, at ease Headache, Fullness in Head: none present Agitation: normal activity Orientation and Clouding of Sensorium: oriented and can do serial additions CIWA-Ar Total: 0 COWS:    Allergies: No Known Allergies  Home Medications:  (Not in  a hospital admission)  OB/GYN Status:  No LMP for male patient.  General Assessment Data Location of Assessment: [facility] ED TTS Assessment: In system Is this a Tele or Face-to-Face Assessment?: Face-to-Face Is this an Initial Assessment or a Re-assessment for this encounter?: Initial Assessment Marital status: Married [place] name: N/A Is patient pregnant?: No Pregnancy Status: No Living Arrangements: Spouse/significant other Can pt return to current living arrangement?: Yes Admission Status: Involuntary Is patient capable of signing voluntary admission?: No Referral Source: Self/Family/Friend Insurance type: IVC     Crisis Care Plan Living Arrangements: Spouse/significant other Name of Psychiatrist: N/A  Education Status Is patient currently in school?: No Highest grade of school patient has completed: 12  Risk to self with the past 6 months Suicidal Ideation: No Has patient been a risk to self within the past 6 months prior to admission? : No Suicidal Intent: No Has patient had any suicidal intent within the past 6 months prior to admission? : No Is patient at risk for suicide?: No Suicidal Plan?: No Has patient had any suicidal plan within the past 6 months prior to admission? : No Access to Means: No What has been your use of drugs/alcohol within the last 12 months?: ETOH, 5-6 12 oz cans daily Previous Attempts/Gestures: No How many times?: 0 Other Self Harm Risks: None  Triggers for Past Attempts: None known Intentional Self Injurious Behavior: None Family Suicide History: No Recent stressful life event(s): Conflict (Comment) (Conflict with wife) Persecutory voices/beliefs?: No Depression: No Substance abuse history and/or treatment  for substance abuse?: Yes Suicide prevention information given to non-admitted patients: Not applicable  Risk to Others within the past 6 months Homicidal Ideation: No Does patient have any lifetime risk of violence toward others  beyond the six months prior to admission? : No Thoughts of Harm to Others: No Current Homicidal Intent: No Current Homicidal Plan: No Access to Homicidal Means: No Identified Victim: N/A History of harm to others?: No Assessment of Violence: On admission Violent Behavior Description: None Does patient have access to weapons?: No Criminal Charges Pending?: No Does patient have a court date: No Is patient on probation?: No  Psychosis Hallucinations: None noted Delusions: None noted  Mental Status Report Appearance/Hygiene: In scrubs Eye Contact: Good Motor Activity: Freedom of movement Speech: Logical/coherent Level of Consciousness: Quiet/awake, Drowsy Mood: Pleasant, Other (Comment) (Intoxicated) Affect: Appropriate to circumstance Anxiety Level: None Judgement: Impaired Orientation: Person, Place, Time Obsessive Compulsive Thoughts/Behaviors: None  Cognitive Functioning Concentration: Normal Memory: Recent Impaired IQ: Average Insight: Fair Impulse Control: Fair Appetite: Good Weight Loss: 0 Weight Gain: 0 Sleep: No Change Total Hours of Sleep: 8 Vegetative Symptoms: None  ADLScreening [facility] Assessment Services) Patient's cognitive ability adequate to safely complete daily activities?: Yes Patient able to express need for assistance with ADLs?: Yes Independently performs ADLs?: Yes (appropriate for developmental age)  Prior Inpatient Therapy Prior Inpatient Therapy: No  Prior Outpatient Therapy Prior Outpatient Therapy: No Does patient have an ACCT team?: No Does patient have Intensive In-House Services?  : No Does patient have [facility] services? : No Does patient have P4CC services?: No  ADL Screening (condition at time of admission) Patient's cognitive ability adequate to safely complete daily activities?: Yes Patient able to express need for assistance with ADLs?: Yes Independently performs ADLs?: Yes (appropriate for developmental age)        Abuse/Neglect Assessment (Assessment to be complete while patient is alone) Physical Abuse: Denies Verbal Abuse: Denies Sexual Abuse: Denies Exploitation of patient/patient's resources: Denies Self-Neglect: Denies Values / Beliefs Cultural Requests During Hospitalization: None Spiritual Requests During Hospitalization: None Consults Spiritual Care Consult Needed: No Social Work Consult Needed: No      Additional Information 1:1 In Past 12 Months?: No CIRT Risk: No Elopement Risk: No     Disposition:  Disposition Initial Assessment Completed for this Encounter: Yes Disposition of Patient: Other dispositions Other disposition(s): Other (Comment) (Psych MD Consult)  On Site Evaluation by:   Reviewed with Physician:    Manus Rudd Garret Teale 06/05/2015 11:02 PM

## 2015-06-05 NOTE — ED Provider Notes (Addendum)
[facility] Emergency Department Provider Note  ____________________________________________  Time seen: Approximately 1040 PM  I have reviewed the triage vital signs and the nursing notes.   HISTORY  Chief Complaint Psychiatric Evaluation    HPI Damon Coleman is a 39 y.o. male without any pertinent medical history presents under involuntary commitment from his family because he has been intoxicated since this past Saturday. Pretty neurologic the patient has also not been eating, bathing and showing erratic behavior. Family also says the patient is not making sense for detox. The patient denies any suicidal or homicidal ideation. Does not give any specific reason for drinking excessively. Says that he drinks 3-4, 22 ouncebeers per day. Denies any pain at this time.    History reviewed. No pertinent past medical history.  There are no active problems to display for this patient.   History reviewed. No pertinent past surgical history.  No current outpatient prescriptions on file.  Allergies Review of patient's allergies indicates no known allergies.  History reviewed. No pertinent family history.  Social History History  Substance Use Topics  . Smoking status: Never Smoker   . Smokeless tobacco: Not on file  . Alcohol Use: Yes    Review of Systems Constitutional: No fever/chills Eyes: No visual changes. ENT: No sore throat. Cardiovascular: Denies chest pain. Respiratory: Denies shortness of breath. Gastrointestinal: No abdominal pain.  No nausea, no vomiting.  No diarrhea.  No constipation. Genitourinary: Negative for dysuria. Musculoskeletal: Negative for back pain. Skin: Negative for rash. Neurological: Negative for headaches, focal weakness or numbness.  10-point ROS otherwise negative.  ____________________________________________   PHYSICAL EXAM:  VITAL SIGNS: ED Triage Vitals  Enc Vitals Group     BP 06/05/15 2115 123/83  mmHg     Pulse Rate 06/05/15 2115 120     Resp 06/05/15 2115 18     Temp 06/05/15 2115 98.2 F (36.8 C)     Temp Source 06/05/15 2115 Oral     SpO2 06/05/15 2115 96 %     Weight 06/05/15 2115 160 lb (72.576 kg)     Height 06/05/15 2115  (1.727 m)     Head Cir --      Peak Flow --      Pain Score 06/05/15 2121 0     Pain Loc --      Pain Edu? --      Excl. in GC? --     Constitutional: Alert and oriented. Well appearing and in no acute distress. Eyes: Conjunctivae are normal. PERRL. EOMI. Head: Atraumatic. Nose: No congestion/rhinnorhea. Mouth/Throat: Mucous membranes are moist.  Oropharynx non-erythematous. Neck: No stridor.   Cardiovascular: Normal rate, regular rhythm. Grossly normal heart sounds.  Good peripheral circulation. Respiratory: Normal respiratory effort.  No retractions. Lungs CTAB. Gastrointestinal: Soft and nontender. No distention. No abdominal bruits. No CVA tenderness. Musculoskeletal: No lower extremity tenderness nor edema.  No joint effusions. Neurologic:  Acutely intoxicated. Sleeping in the bed but easily arousable and protecting airway. No slurred speech and normal language. No gross focal neurologic deficits are appreciated. No tremulousness. Skin:  Skin is warm, dry and intact. No rash noted. Psychiatric: Mood and affect are normal. Speech and behavior are normal.  ____________________________________________   LABS (all labs ordered are listed, but only abnormal results are displayed)  Labs Reviewed  COMPREHENSIVE METABOLIC PANEL - Abnormal; Notable for the following:    Glucose, Bld 109 (*)    Total Protein 8.8 (*)    AST 51 (*)  Anion gap 16 (*)    All other components within normal limits  ETHANOL - Abnormal; Notable for the following:    Alcohol, Ethyl (B) 451 (*)    All other components within normal limits  CBC - Abnormal; Notable for the following:    Hemoglobin 18.6 (*)    HCT 54.7 (*)    All other components within normal  limits  URINALYSIS COMPLETEWITH MICROSCOPIC ([facility] ONLY) - Abnormal; Notable for the following:    Color, Urine COLORLESS (*)    APPearance CLEAR (*)    Specific Gravity, Urine 1.002 (*)    All other components within normal limits  URINE DRUG SCREEN, QUALITATIVE ([facility] ONLY)  ACETAMINOPHEN LEVEL  SALICYLATE LEVEL   ____________________________________________  EKG   ____________________________________________  RADIOLOGY   ____________________________________________   PROCEDURES   ____________________________________________   INITIAL IMPRESSION / ASSESSMENT AND PLAN / ED COURSE  Pertinent labs & imaging results that were available during my care of the patient were reviewed by me and considered in my medical decision making (see chart for details).  We'll uphold involuntary commitment. Psychiatry consult. Patient understands plan that he will need to stay overnight in the hospital in order to sober and then to speak with the psychiatrist tomorrow. CIWA protocol ordered. ____________________________________________   FINAL CLINICAL IMPRESSION(S) / ED DIAGNOSES  Acute alcohol intoxication. Initial visit    Myrna Blazer, MD 06/05/15 2343  Patient answered questions appropriately during my history and physical. Feel that the patient's symptoms are more likely related to acute intoxication then to an encephalopathy. We'll give by mouth for gas as well as thiamine for nutritional repletion. However, do not believe is in acute encephalopathic state.  Myrna Blazer, MD 06/05/15 [phone]

## 2015-06-05 NOTE — ED Notes (Signed)
Pt brought to er by [employer] under IVC. Pt has been reported to have been intoxicated since Saturday. Per IVC pt is reported to have not been eating, bathing and has had erratic behavior.Also per IVC pt has not been making sense when he is talking.  Pt reports to me that he has been drinking since Saturday, but that he normally only drinks on the weekends. Pt reports having drank 5-6 12 ounce beers today 06/05/15. Pt denies use of illicit drugs. Pt denies SI,HI,AH,VH. Pt does report smoking 3-4 cigarettes per day. Pt did ask if he could leave and go to his sister's house. I informed pt that he is under Involuntary comitment and that he will have to see MD, Intake staff and possibly Psych MD prior to being released at the earliest tomorrow. I also explained that we will be monitoring pt due to his ETOH intake to monitor for withdrawal symptoms prior to him being released. Pt stated "thank you" after I explained this information to him.  During assessment, pt was sleepy, and started to fall asleep at end of assessment. Pt laying on right side on stretcher with eyes closed and lights turned of when I left the room.

## 2015-06-05 NOTE — ED Notes (Addendum)
Patient assigned to appropriate care area. Patient oriented to unit/care area: Informed that, for their safety, care areas are designed for safety and monitored by security cameras at all times; and visiting hours explained to patient. Patient intoxicated unable to verbalize contract for safety obtained.  BEHAVIORAL HEALTH ROUNDING Patient sleeping: No. Patient alert and oriented: Yes oriented to self and place Behavior appropriate: Yes.   Nutrition and fluids offered: Yes  Toileting and hygiene offered: Yes  Sitter present: q15 min observations Law enforcement present: Yes [employer]  ENVIRONMENTAL ASSESSMENT Potentially harmful objects out of patient reach: Yes.   Personal belongings secured: Yes.   Patient dressed in hospital provided attire only: Yes.   Plastic bags out of patient reach: Yes.   Patient care equipment (cords, cables, call bells, lines, and drains) shortened, removed, or accounted for: Yes.   Equipment and supplies removed from bottom of stretcher: Yes.   Potentially toxic materials out of patient reach: Yes.   Sharps container removed or out of patient reach: Yes.

## 2015-06-06 LAB — SALICYLATE LEVEL

## 2015-06-06 LAB — ACETAMINOPHEN LEVEL: Acetaminophen (Tylenol), Serum: <10 ug/mL — ABNORMAL LOW (ref 10–30)

## 2015-06-06 NOTE — Discharge Instructions (Signed)
Intoxicacin alcohlica (Alcohol Intoxication) La intoxicacin alcohlica se produce cuando la cantidad de alcohol que se ha consumido daa la capacidad de funcionamiento mental y fsico. El alcohol deteriora directamente la actividad qumica normal del cerebro. Beber grandes cantidades de alcohol puede conducir a Insurance underwriter funcionamiento mental y en el comportamiento, y puede causar muchos efectos fsicos que pueden ser perjudiciales.  La intoxicacin alcohlica puede variar en gravedad desde leve hasta muy grave. Hay varios factores que pueden afectar el nivel de intoxicacin que se produce, como la edad de la persona, el sexo, el peso, la frecuencia de consumo de alcohol, y la presencia de otras enfermedades mdicas (como diabetes, convulsiones o enfermedades del corazn). Los niveles peligrosos de intoxicacin por alcohol pueden ocurrir [employer] personas beben grandes cantidades de alcohol en un corto periodo de tiempo ([place]). El alcohol tambin puede ser especialmente peligroso cuando se combina con ciertos medicamentos recetados o drogas "recreativas". SIGNOS Y SNTOMAS Algunos de los signos y sntomas comunes de intoxicacin leve por alcohol incluyen:  Prdida de la coordinacin.  Cambios en el estado de nimo y la conducta.  Incapacidad para razonar.  Hablar arrastrando las palabras. A medida que la intoxicacin por alcohol avanza a niveles ms graves, Lucianne Lei a Arts administrator otros signos y sntomas. Estos pueden ser:  Vmitos.  Confusin y alteracin de Sales promotion account executive.  Disminucin de Secretary/administrator.  Convulsiones.  Prdida de la conciencia. DIAGNSTICO  El mdico le har una historia clnica y un examen fsico. Se le preguntar acerca de la cantidad y el tipo de alcohol que ha consumido. Se le realizarn anlisis de sangre para medir la concentracin de alcohol en sangre. En muchos lugares, el nivel de alcohol en la sangre debe ser inferior a 80 mg / dL (0,08%) para  poder conducir legalmente. Sin embargo, hay muchos efectos peligrosos del alcohol que pueden ocurrir con niveles mucho ms bajos.  [place] con intoxicacin por alcohol a menudo no requieren Clinical research associate. La mayor parte de los efectos de la intoxicacin por alcohol son temporales, y desaparecen a medida que el alcohol abandona el cuerpo de forma natural. El profesional controlar su estado hasta que est lo suficientemente estable como para volver a casa. A veces se administran lquidos por va intravenosa para ayudar a evitar la deshidratacin.  INSTRUCCIONES PARA EL CUIDADO EN EL HOGAR  No conduzca vehculos despus de beber alcohol.  Mantngase hidratado. Beba gran cantidad de lquido para mantener la orina de tono claro o color amarillo plido. Evite la cafena.   Tome slo medicamentos de venta libre o recetados, segn las indicaciones del mdico.  SOLICITE ATENCIN MDICA SI:   Tiene vmitos persistentes.   No mejora luego de [employer].  Se intoxica con alcohol con frecuencia. El mdico podr ayudarlo a decidir si debe consultar a un terapeuta especializado en el abuso de sustancias. SOLICITE ATENCIN MDICA DE INMEDIATO SI:   Se siente vacilante o tembloroso cuando trata de abandonar el hbito.   Comienza a temblar de manera incontrolable (convulsiones).   Vomita sangre. Puede ser sangre de color rojo brillante o similar al sedimento del caf negro.   Lollie Marrow en la materia fecal. Puede ser de color rojo brillante o de aspecto alquitranado, con olor ftido.   Se siente mareado o se desmaya.  ASEGRESE DE QUE:   Comprende estas instrucciones.  Controlar su afeccin.  Recibir ayuda de inmediato si no mejora o si empeora. Document Released: 10/18/2005 Document Revised: 06/20/2013 ExitCare Patient Information  2015 [employer]. This information is not intended to replace advice given to you by your health care provider. Make sure you  discuss any questions you have with your health care provider. ° °

## 2015-06-06 NOTE — ED Notes (Signed)
ED BHU PLACEMENT JUSTIFICATION Is the patient under IVC or is there intent for IVC: Yes.    Is IVC current? Yes Is the patient medically cleared: No.   Is there vacancy in the ED BHU: Yes.   Is the population mix appropriate for patient: no pt with IV in place and banana bag running prior to transfer to BHU.   Is the patient awaiting placement in inpatient or outpatient setting: unknown at this time   Has the patient had a psychiatric consult: No.   Survey of unit performed for contraband, proper placement and condition of furniture, tampering with fixtures in bathroom, shower, and each patient room: Yes.  ; Findings: none APPEARANCE/BEHAVIOR Cooperative,calm NEURO ASSESSMENT Orientation: oriented to self and place Hallucinations: none noted at this time Speech: Normal Gait: normal RESPIRATORY ASSESSMENT Breathing Pattern-regular, no respiratory distress noted CARDIOVASCULAR ASSESSMENT Skin color appropriate for age and race GASTROINTESTINAL ASSESSMENT no GI distress noted EXTREMITIES Moves all extremities, no distress noted PLAN OF CARE Provide calm/safe environment. Vital signs assessed twice daily. ED BHU Assessment once each 12-hour shift. Collaborate with intake RN daily or as condition indicates. Assure the ED provider has rounded once each shift. Provide and encourage hygiene. Provide redirection as needed. Assess for escalating behavior; address immediately and inform ED provider.  Assess family dynamic and appropriateness for visitation as needed: Yes.  Educate the patient/family about BHU procedures/visitation: Yes.

## 2015-06-06 NOTE — ED Notes (Addendum)
BEHAVIORAL HEALTH ROUNDING Patient sleeping: No. Patient alert and oriented: yes, pt oriented to self and place Behavior appropriate: Yes.   Nutrition and fluids offered: Yes  Toileting and hygiene offered: Yes  Sitter present: q15 min observations Law enforcement present: Yes [employer]

## 2015-06-06 NOTE — ED Notes (Signed)
BEHAVIORAL HEALTH ROUNDING Patient sleeping: Yes.   Patient alert and oriented: not applicable Behavior appropriate: Yes.    Nutrition and fluids offered: No Toileting and hygiene offered: No Sitter present: q15 minute observations Law enforcement present: Yes [employer] 

## 2015-06-06 NOTE — ED Notes (Signed)
BEHAVIORAL HEALTH ROUNDING Patient sleeping: No. Patient alert and oriented: yes. Pt oriented to person and place Behavior appropriate: Yes.   Nutrition and fluids offered: Yes  Toileting and hygiene offered: Yes  Sitter present: q15 min observations Law enforcement present: Yes [employer]

## 2015-06-06 NOTE — ED Notes (Signed)
Pt given clothes and belongings, wife aware of discharge

## 2015-06-06 NOTE — ED Notes (Signed)
Pt given breakfast tray

## 2015-06-06 NOTE — ED Notes (Signed)
BEHAVIORAL HEALTH ROUNDING Patient sleeping: Yes.   Patient alert and oriented: yes Behavior appropriate: Yes.  ;  Nutrition and fluids offered: Yes  Toileting and hygiene offered: Yes  Sitter present: yes Law enforcement present: Yes   ENVIRONMENTAL ASSESSMENT Potentially harmful objects out of patient reach: Yes.   Personal belongings secured: Yes.   Patient dressed in hospital provided attire only: Yes.   Plastic bags out of patient reach: Yes.   Patient care equipment (cords, cables, call bells, lines, and drains) shortened, removed, or accounted for: Yes.   Equipment and supplies removed from bottom of stretcher: Yes.   Potentially toxic materials out of patient reach: Yes.   Sharps container removed or out of patient reach: Yes.     

## 2015-06-06 NOTE — ED Notes (Signed)
Pt given instructions concerning [facility] and detox facilities

## 2015-06-06 NOTE — BHH Counselor (Signed)
Per request of ER MD (Dr. Mayford Knife), writer provided the pt. with information and instructions on how to access Outpatient. Substance Abuse Treatment ([facility]).

## 2015-06-06 NOTE — ED Notes (Addendum)
BEHAVIORAL HEALTH ROUNDING Patient sleeping: No. Patient alert and oriented: yes Behavior appropriate: Yes.  ;  Nutrition and fluids offered: Yes  Toileting and hygiene offered: Yes  Sitter present: yes Law enforcement present: Yes  

## 2015-06-06 NOTE — ED Provider Notes (Signed)
Intoxication has improved, patient is stable for outpatient follow-up.  Emily Filbert, MD 06/06/15 [phone]

## 2015-08-02 DEATH — deceased
